# Patient Record
Sex: Male | Born: 1960 | Race: White | Hispanic: No | Marital: Married | Smoking: Never smoker
Health system: Southern US, Community
[De-identification: ages and names within clinical notes are randomized; demographics above are authoritative.]

## PROBLEM LIST (undated history)

## (undated) DIAGNOSIS — I10 Essential (primary) hypertension: Secondary | ICD-10-CM

## (undated) HISTORY — DX: Essential (primary) hypertension: I10

---

## 2003-09-03 ENCOUNTER — Emergency Department (HOSPITAL_COMMUNITY): Admission: EM | Admit: 2003-09-03 | Discharge: 2003-09-03 | Payer: Self-pay | Admitting: Emergency Medicine

## 2013-03-14 ENCOUNTER — Other Ambulatory Visit: Payer: Self-pay | Admitting: Otolaryngology

## 2013-03-14 DIAGNOSIS — H9190 Unspecified hearing loss, unspecified ear: Secondary | ICD-10-CM

## 2013-03-14 DIAGNOSIS — H60399 Other infective otitis externa, unspecified ear: Secondary | ICD-10-CM

## 2013-03-18 ENCOUNTER — Ambulatory Visit
Admission: RE | Admit: 2013-03-18 | Discharge: 2013-03-18 | Disposition: A | Discharge status: Home / Self Care | Payer: 59 | Source: Ambulatory Visit | Attending: Otolaryngology | Admitting: Otolaryngology

## 2013-03-18 DIAGNOSIS — H9190 Unspecified hearing loss, unspecified ear: Secondary | ICD-10-CM

## 2013-03-18 DIAGNOSIS — H60399 Other infective otitis externa, unspecified ear: Secondary | ICD-10-CM

## 2016-07-13 DIAGNOSIS — N401 Enlarged prostate with lower urinary tract symptoms: Secondary | ICD-10-CM | POA: Diagnosis not present

## 2016-12-21 DIAGNOSIS — J029 Acute pharyngitis, unspecified: Secondary | ICD-10-CM | POA: Diagnosis not present

## 2016-12-21 DIAGNOSIS — R221 Localized swelling, mass and lump, neck: Secondary | ICD-10-CM | POA: Diagnosis not present

## 2016-12-28 DIAGNOSIS — I10 Essential (primary) hypertension: Secondary | ICD-10-CM | POA: Diagnosis not present

## 2016-12-28 DIAGNOSIS — E7800 Pure hypercholesterolemia, unspecified: Secondary | ICD-10-CM | POA: Diagnosis not present

## 2017-02-01 DIAGNOSIS — Z23 Encounter for immunization: Secondary | ICD-10-CM | POA: Diagnosis not present

## 2017-08-02 DIAGNOSIS — N401 Enlarged prostate with lower urinary tract symptoms: Secondary | ICD-10-CM | POA: Diagnosis not present

## 2017-08-02 DIAGNOSIS — R351 Nocturia: Secondary | ICD-10-CM | POA: Diagnosis not present

## 2017-08-09 DIAGNOSIS — N401 Enlarged prostate with lower urinary tract symptoms: Secondary | ICD-10-CM | POA: Diagnosis not present

## 2018-01-10 DIAGNOSIS — I10 Essential (primary) hypertension: Secondary | ICD-10-CM | POA: Diagnosis not present

## 2018-01-10 DIAGNOSIS — E7800 Pure hypercholesterolemia, unspecified: Secondary | ICD-10-CM | POA: Diagnosis not present

## 2018-01-24 DIAGNOSIS — Z23 Encounter for immunization: Secondary | ICD-10-CM | POA: Diagnosis not present

## 2018-03-08 DIAGNOSIS — Z86010 Personal history of colonic polyps: Secondary | ICD-10-CM | POA: Diagnosis not present

## 2018-03-08 DIAGNOSIS — K601 Chronic anal fissure: Secondary | ICD-10-CM | POA: Diagnosis not present

## 2019-11-07 ENCOUNTER — Other Ambulatory Visit: Payer: Self-pay | Admitting: Nurse Practitioner

## 2019-11-07 ENCOUNTER — Other Ambulatory Visit: Payer: Self-pay

## 2019-11-07 ENCOUNTER — Ambulatory Visit
Admission: RE | Admit: 2019-11-07 | Discharge: 2019-11-07 | Disposition: A | Discharge status: Home / Self Care | Payer: Self-pay | Source: Ambulatory Visit | Attending: Nurse Practitioner | Admitting: Nurse Practitioner

## 2019-11-07 DIAGNOSIS — S8992XA Unspecified injury of left lower leg, initial encounter: Secondary | ICD-10-CM

## 2019-11-07 DIAGNOSIS — R609 Edema, unspecified: Secondary | ICD-10-CM

## 2020-10-07 ENCOUNTER — Ambulatory Visit: Payer: 59 | Admitting: Dermatology

## 2020-10-07 ENCOUNTER — Encounter: Payer: Self-pay | Admitting: Dermatology

## 2020-10-07 ENCOUNTER — Other Ambulatory Visit: Payer: Self-pay

## 2020-10-07 DIAGNOSIS — L918 Other hypertrophic disorders of the skin: Secondary | ICD-10-CM

## 2020-10-07 DIAGNOSIS — M67471 Ganglion, right ankle and foot: Secondary | ICD-10-CM

## 2020-10-07 DIAGNOSIS — B353 Tinea pedis: Secondary | ICD-10-CM

## 2020-10-07 NOTE — Patient Instructions (Signed)
[employer]

## 2020-10-18 ENCOUNTER — Encounter: Payer: Self-pay | Admitting: Dermatology

## 2020-10-18 NOTE — Progress Notes (Signed)
   New Patient   Subjective  John Graves is a 60 y.o. male who presents for the following: New Patient (Initial Visit) (Patient here today for lesion on his right 4th toe x 6-7 months per patient it's just irritating when rubbing on his work boots. No bleeding no pain. Patient would also like a lesion on his back x 2-3 months hat does itch and irritating, no bleeding. ).  General skin check, check spot on foot Location:  Duration:  Quality:  Associated Signs/Symptoms: Modifying Factors:  Severity:  Timing: Context:    The following portions of the chart were reviewed this encounter and updated as appropriate:  Tobacco  Allergies  Meds  Problems  Med Hx  Surg Hx  Fam Hx      Objective  Well appearing patient in no apparent distress; mood and affect are within normal limits. Right 4th Proximal Dorsal Toe 3 mm deep dermal vesicular papule.  Nail normal.  Left Upper Back Fleshy, skin-colored pedunculated 2 mm papule  Right 1st-2nd Toe Web, Right 2nd-3rd Toe Web, Right 3rd-4th Toe Web, Right 4th-5th Toe Web Macerated not eroded hyperkeratosis typical of mixed tinea plus bacteria  Left 1st-2nd Toe Web, Left 2nd-3rd Toe Web, Left 3rd-4th Toe Web, Left 4th-5th Toe Web Mixed tinea plus bacteria; macerated white hyperkeratosis    A full examination was performed including scalp, head, eyes, ears, nose, lips, neck, chest, axillae, abdomen, back, buttocks, bilateral upper extremities, bilateral lower extremities, hands, feet, fingers, toes, fingernails, and toenails. All findings within normal limits unless otherwise noted below.   Assessment & Plan  Digital mucous cyst of toe of right foot Right 4th Proximal Dorsal Toe  Discussed synovial cyst.  May choose to have excision in future.  Skin tag Left Upper Back  Benign okay to leave unless patient wants removed.   Tinea pedis of right foot (4) Right 1st-2nd Toe Web; Right 2nd-3rd Toe Web; Right 3rd-4th Toe  Web; Right 4th-5th Toe Web  Currently no symptoms.  May try over-the-counter clotrimazole cream daily after bathing for 1 month.  Tinea pedis of left foot (4) Left 1st-2nd Toe Web; Left 2nd-3rd Toe Web; Left 3rd-4th Toe Web; Left 4th-5th Toe Web  Over-the-counter clotrimazole.

## 2021-03-03 ENCOUNTER — Other Ambulatory Visit: Payer: Self-pay

## 2021-03-03 ENCOUNTER — Encounter: Payer: Self-pay | Admitting: Dermatology

## 2021-03-03 ENCOUNTER — Ambulatory Visit: Payer: 59 | Admitting: Dermatology

## 2021-03-03 DIAGNOSIS — D485 Neoplasm of uncertain behavior of skin: Secondary | ICD-10-CM | POA: Diagnosis not present

## 2021-03-03 NOTE — Patient Instructions (Signed)

## 2021-03-22 ENCOUNTER — Encounter: Payer: Self-pay | Admitting: Dermatology

## 2021-03-22 NOTE — Progress Notes (Signed)
° °  Follow-Up Visit   Subjective  John Graves is a 60 y.o. male who presents for the following: Follow-up (Patient here today for biopsy on his back, per patient it wasn't removed at his last visit because he was told to wait until the winter time to have it removed. No personal history or family history of atypical moles, melanoma or non mole skin cancer. ).  Lesion on back to biopsy Location:  Duration:  Quality:  Associated Signs/Symptoms: Modifying Factors:  Severity:  Timing: Context:   Objective  Well appearing patient in no apparent distress; mood and affect are within normal limits. Left Lower Back Soft pink partially, visible dermal papule, dermoscopy amorphous.  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         All skin waist up examined.   Assessment & Plan    Neoplasm of uncertain behavior of skin Left Lower Back  Skin / nail biopsy Type of biopsy: tangential   Informed consent: discussed and consent obtained   Timeout: patient name, date of birth, surgical site, and procedure verified   Procedure prep:  Patient was prepped and draped in usual sterile fashion (Non sterile) Prep type:  Chlorhexidine Anesthesia: the lesion was anesthetized in a standard fashion   Anesthetic:  1% lidocaine w/ epinephrine 1-100,000 local infiltration Instrument used: flexible razor blade   Outcome: patient tolerated procedure well   Post-procedure details: wound care instructions given   Additional details:  Cautery only (after shave biopsy)  Specimen 1 - Surgical pathology Differential Diagnosis: r/o atypia, cn, tag  Check Margins: No      I, Lavonna Monarch, MD, have reviewed all documentation for this visit.  The documentation on 03/22/21 for the exam, diagnosis, procedures, and orders are all accurate and complete.

## 2021-06-15 IMAGING — CR DG KNEE COMPLETE 4+V*L*
4 series · 4 of 4 positions shown · non-contrast
Comparison: None.

CLINICAL DATA: Fell onto knee

EXAM:
LEFT KNEE - COMPLETE 4+ VIEW

[w knee ap left]
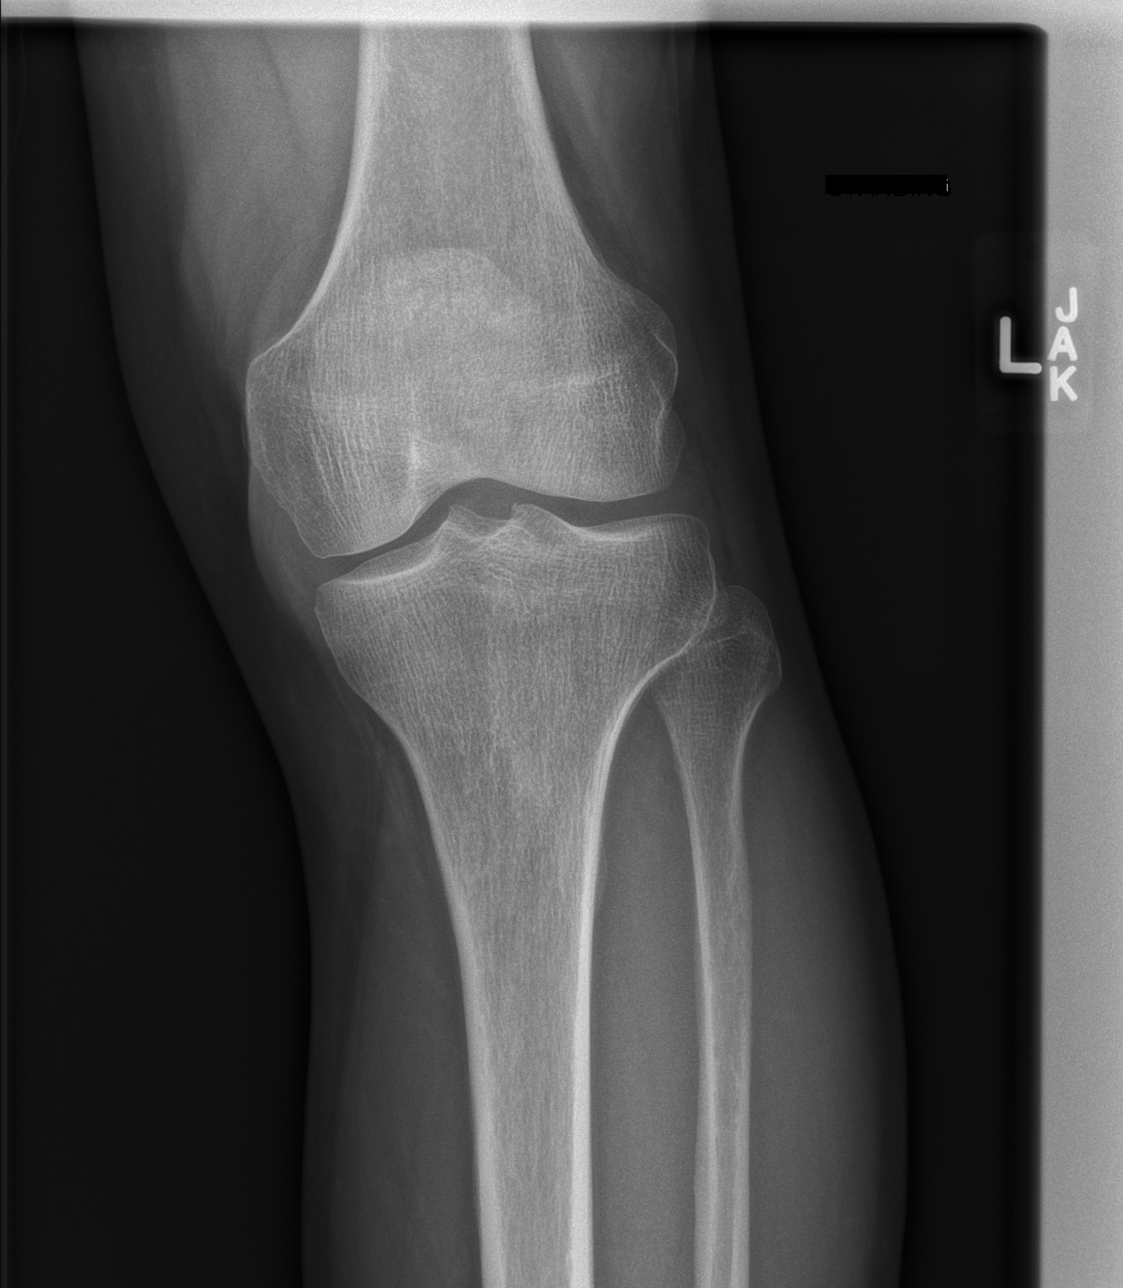

[w knee lat left]
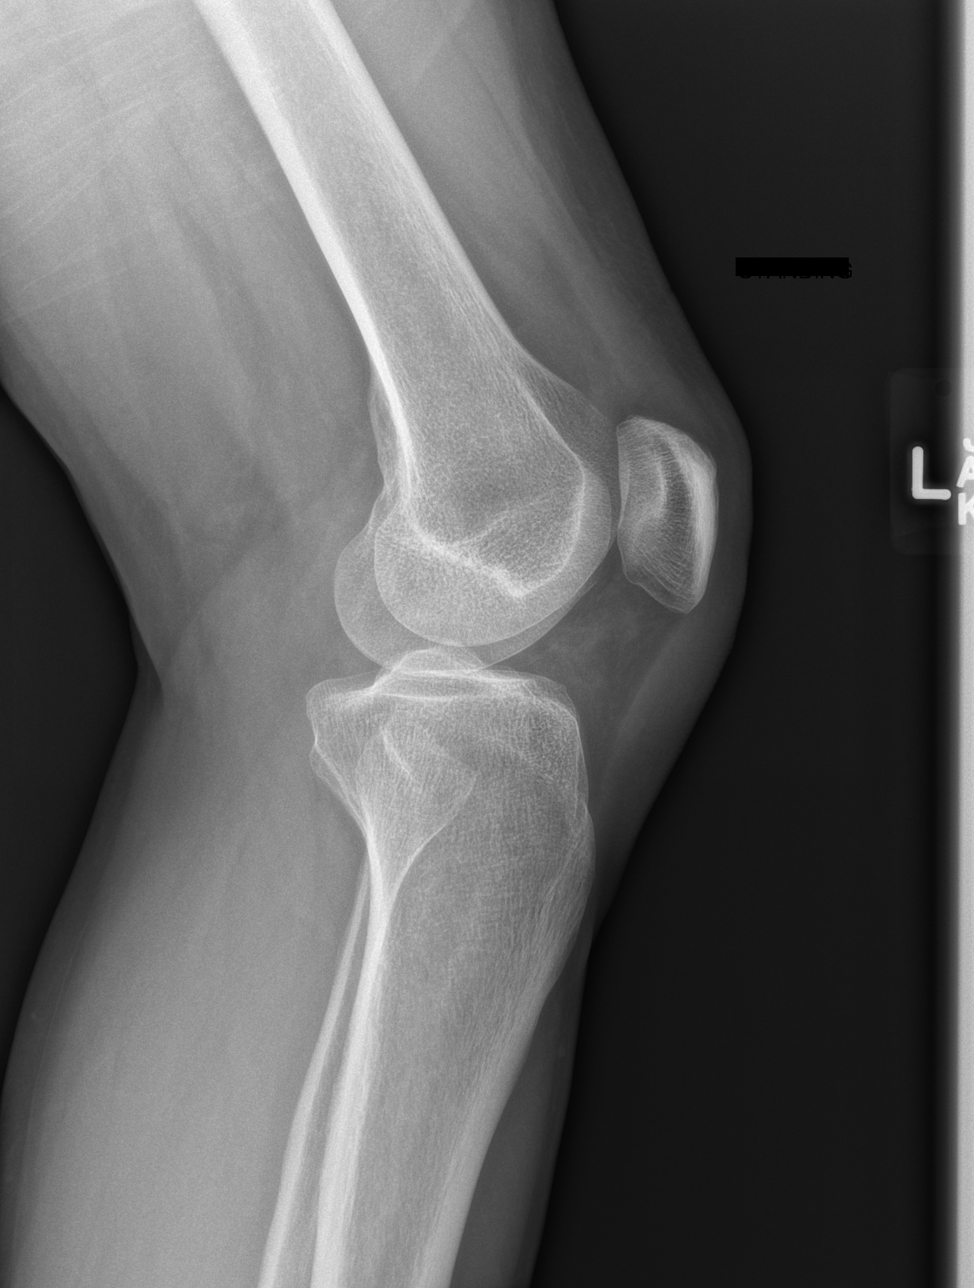

[w knee tunnel pa left]
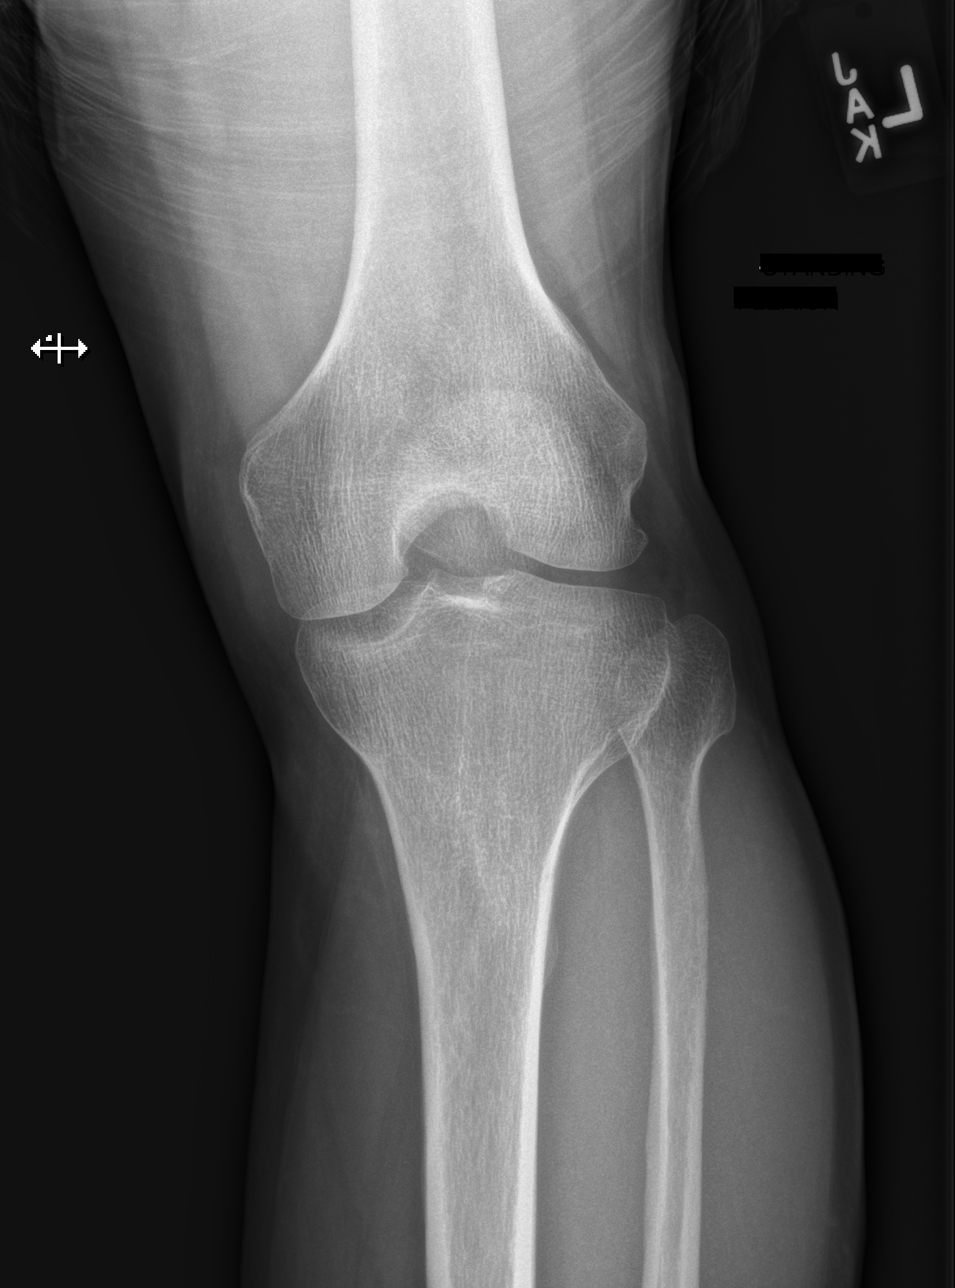

[x knee sunrise left]
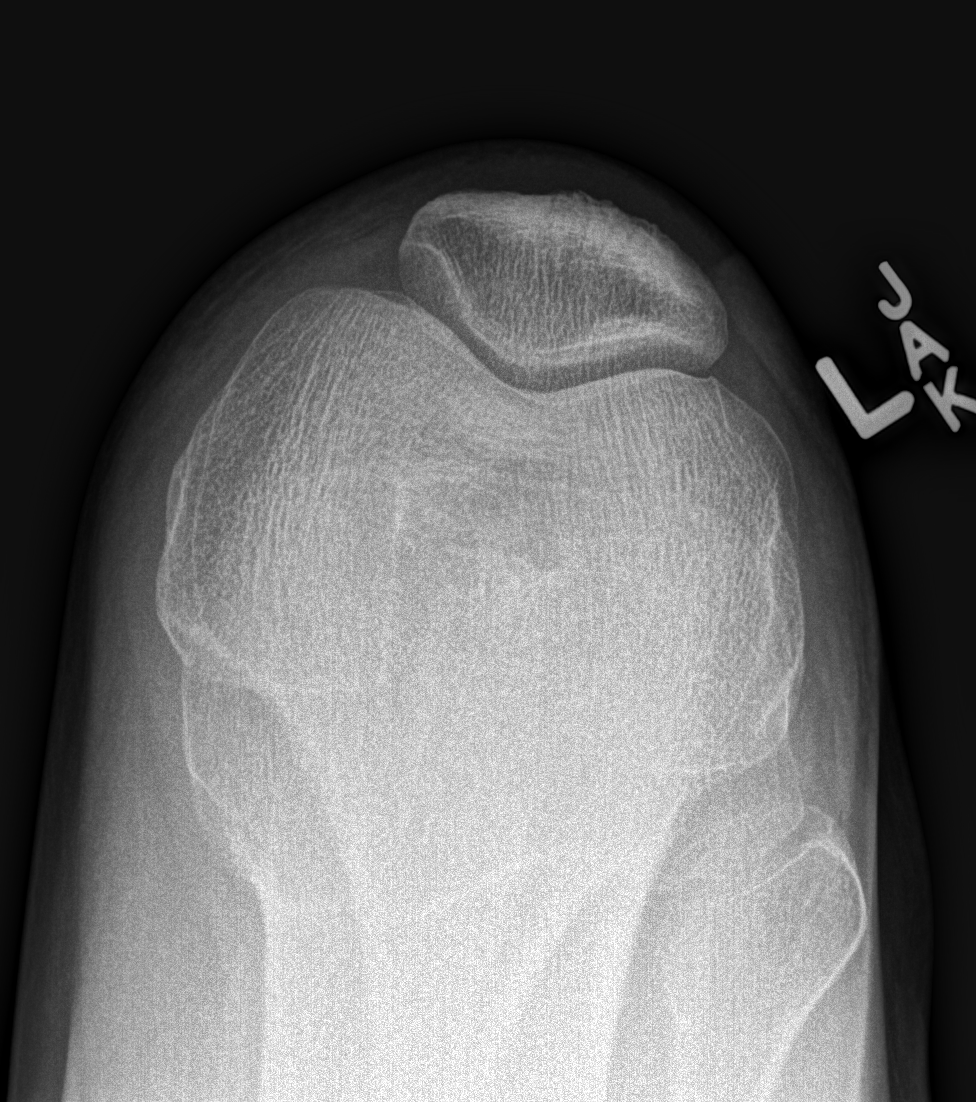

[4 of 4 positions shown; findings below may reference images not displayed]

FINDINGS: No acute fracture or dislocation. Joint spaces and alignment are
maintained. No area of erosion or osseous destruction. No unexpected
radiopaque foreign body. Soft tissues are unremarkable.
IMPRESSION: No acute fracture or dislocation.
